# Patient Record
Sex: Male | Born: 2000 | Race: White | Hispanic: No | Marital: Single | Smoking: Never smoker
Health system: Southern US, Community
[De-identification: ages and names within clinical notes are randomized; demographics above are authoritative.]

## PROBLEM LIST (undated history)

## (undated) HISTORY — PX: ADENOIDECTOMY: SUR15

## (undated) HISTORY — PX: TONSILLECTOMY: SUR1361

---
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
## 2018-12-27 ENCOUNTER — Telehealth: Payer: Self-pay | Admitting: *Deleted

## 2018-12-27 ENCOUNTER — Other Ambulatory Visit: Payer: Self-pay

## 2018-12-27 DIAGNOSIS — Z20822 Contact with and (suspected) exposure to covid-19: Secondary | ICD-10-CM

## 2018-12-27 NOTE — Telephone Encounter (Signed)
Pt called to get his MyChart set up. Link sent. Advised to call back for any questions. He voice understanding.

## 2018-12-28 LAB — NOVEL CORONAVIRUS, NAA: SARS-CoV-2, NAA: DETECTED — AB

## 2019-09-18 ENCOUNTER — Ambulatory Visit: Payer: PRIVATE HEALTH INSURANCE | Admitting: Family Medicine
